# Patient Record
Sex: Male | Born: 1975 | Race: White | Hispanic: No | Marital: Married | Smoking: Former smoker
Health system: Southern US, Community
[De-identification: ages and names within clinical notes are randomized; demographics above are authoritative.]

## PROBLEM LIST (undated history)

## (undated) DIAGNOSIS — K219 Gastro-esophageal reflux disease without esophagitis: Secondary | ICD-10-CM

## (undated) DIAGNOSIS — Z789 Other specified health status: Secondary | ICD-10-CM

## (undated) HISTORY — PX: NO PAST SURGERIES: SHX2092

## (undated) HISTORY — DX: Other specified health status: Z78.9

---

## 2019-04-19 ENCOUNTER — Ambulatory Visit (INDEPENDENT_AMBULATORY_CARE_PROVIDER_SITE_OTHER): Payer: Self-pay | Admitting: Otolaryngology

## 2020-03-19 ENCOUNTER — Ambulatory Visit (INDEPENDENT_AMBULATORY_CARE_PROVIDER_SITE_OTHER): Payer: BC Managed Care – PPO | Admitting: Medical-Surgical

## 2020-03-19 ENCOUNTER — Other Ambulatory Visit: Payer: Self-pay

## 2020-03-19 ENCOUNTER — Encounter: Payer: Self-pay | Admitting: Medical-Surgical

## 2020-03-19 VITALS — BP 130/79 | HR 81 | Temp 97.8°F | Ht 68.5 in | Wt 165.2 lb

## 2020-03-19 DIAGNOSIS — Z8639 Personal history of other endocrine, nutritional and metabolic disease: Secondary | ICD-10-CM

## 2020-03-19 DIAGNOSIS — M25512 Pain in left shoulder: Secondary | ICD-10-CM

## 2020-03-19 DIAGNOSIS — G8929 Other chronic pain: Secondary | ICD-10-CM

## 2020-03-19 DIAGNOSIS — Z0000 Encounter for general adult medical examination without abnormal findings: Secondary | ICD-10-CM

## 2020-03-19 DIAGNOSIS — Z7689 Persons encountering health services in other specified circumstances: Secondary | ICD-10-CM

## 2020-03-19 DIAGNOSIS — K4090 Unilateral inguinal hernia, without obstruction or gangrene, not specified as recurrent: Secondary | ICD-10-CM

## 2020-03-19 DIAGNOSIS — G4701 Insomnia due to medical condition: Secondary | ICD-10-CM

## 2020-03-19 MED ORDER — BACLOFEN 10 MG PO TABS: 10.0000 mg | ORAL_TABLET | Freq: Every evening | ORAL | 0 refills | Status: DC | PRN

## 2020-03-19 NOTE — Progress Notes (Signed)
New Patient Office Visit  Subjective:  Patient ID: Reginald Fischer, male    DOB: Jul 21, 1975  Age: 45 y.o. MRN: [id number]  CC:  Chief Complaint  Patient presents with  . Establish Care    HPI Reginald Fischer presents to establish care.   Hernia-Notes that he developed a right inguinal bulge about 7 months ago. The bulge is not necessarily painful but does get irritated if he walks a lot or lifts anything. He is very active usually and notes that this possible hernia has started to become irritating. He now reports there is some slight pain on the left side as well. Has not tried anything for the hernia or discomfort.  History of left shoulder and neck problems. 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 He was doing physical therapy and dry needling but has not been since June. Notes that he plans to return to physical therapy and he already has a provider in place. Has difficulty sleeping at night due to left shoulder pain and preferred sleeping position on the left side. Uses CBD and THC regularly.  Insomnia-this is mostly related to his physical discomfort related to his left shoulder and neck. He has difficulty falling asleep to a slight degree but has trouble staying asleep at night because he cannot get comfortable. He does take 2 Advil PM nightly which used to be effective but is not so much now. Notes that years ago, he took Ambien to help with sleep.  Past Medical History:  Diagnosis Date  . No pertinent past medical history     Past Surgical History:  Procedure Laterality Date  . NO PAST SURGERIES      Family History  Family history unknown: Yes    Social History   Socioeconomic History  . Marital status: Married    Spouse name: Not on file  . Number of children: Not on file  . Years of education: Not on file  . Highest education level: Not on file  Occupational History  . Not on file  Tobacco Use  . Smoking status: Former Smoker    Quit  date: 05/2019    Years since quitting: 0.8  . Smokeless tobacco: Never Used  Vaping Use  . Vaping Use: Every day  . Substances: CBD  Substance and Sexual Activity  . Alcohol use: Yes    Comment: Occassionally  . Drug use: Yes    Frequency: 7.0 times per week    Types: Marijuana    Comment: daily use  . Sexual activity: Yes    Birth control/protection: None  Other Topics Concern  . Not on file  Social History Narrative  . Not on file   Social Determinants of Health   Financial Resource Strain: Not on file  Food Insecurity: Not on file  Transportation Needs: Not on file  Physical Activity: Not on file  Stress: Not on file  Social Connections: Not on file  Intimate Partner Violence: Not on file    ROS Review of Systems  Constitutional: Negative for chills, fatigue, fever and unexpected weight change.  Respiratory: Positive for cough (25 year smoking history; THC ). Negative for shortness of breath and wheezing.   Cardiovascular: Negative for chest pain, palpitations and leg swelling.  Genitourinary: Negative for dysuria, frequency, hematuria and urgency.  Musculoskeletal: Positive for arthralgias, myalgias and neck pain.  Neurological: Negative for dizziness, light-headedness and headaches.  Psychiatric/Behavioral: Positive for sleep disturbance. Negative for dysphoric mood, self-injury and suicidal  ideas. The patient is nervous/anxious.     Objective:   Today's Vitals: BP 130/79   Pulse 81   Temp 97.8 F (36.6 C)   Ht 5' 8.5" (1.74 m)   Wt 165 lb 3.2 oz (74.9 kg)   SpO2 97%   BMI 24.75 kg/m   Physical Exam Vitals and nursing note reviewed.  Constitutional:      General: He is not in acute distress.    Appearance: Normal appearance.  HENT:     Head: Normocephalic and atraumatic.  Cardiovascular:     Rate and Rhythm: Normal rate and regular rhythm.     Pulses: Normal pulses.     Heart sounds: Normal heart sounds. No murmur heard. No friction rub. No  gallop.   Pulmonary:     Effort: Pulmonary effort is normal. No respiratory distress.     Breath sounds: Normal breath sounds.  Abdominal:     General: Abdomen is flat. There is no distension.     Palpations: Abdomen is soft.     Hernia: A hernia (Right inguinal) is present.  Musculoskeletal:        General: Tenderness (Left shoulder and neck) present.  Skin:    General: Skin is warm and dry.  Neurological:     Mental Status: He is alert and oriented to person, place, and time.  Psychiatric:        Mood and Affect: Mood normal.        Behavior: Behavior normal.        Thought Content: Thought content normal.        Judgment: Judgment normal.    Assessment & Plan:   1. Encounter to establish care Reviewed available information and discussed healthcare concerns with patient. He is well overdue for preventative care so we will add this to our work-up today.  2. Unilateral inguinal hernia without obstruction or gangrene, recurrence not specified Notable right inguinal bulge that is soft and reducible, nontender. Referring to general surgery for further evaluation. - Ambulatory referral to General Surgery  3. Insomnia due to medical condition Since his insomnia is likely due to chronic pain and discomfort, we will trial Baclofen 10mg  nightly as needed to see if this is helpful.   4. Chronic left shoulder pain Resume physical therapy and dry needling. Tylenol and ibuprofen as needed. Baclofen 10 mg nightly as needed. Requested patient bring in imaging disc from his x-rays completed by his chiropractor for review.  5. Encounter for preventive care Checking CBC, CMP, and lipid panel today. - CBC - COMPLETE METABOLIC PANEL WITH GFR - Lipid panel  6. History of vitamin D deficiency Continue vitamin D supplementation. Checking vitamin D levels today. - VITAMIN D 25 Hydroxy (Vit-D Deficiency, Fractures)   Outpatient Encounter Medications as of 03/19/2020  Medication Sig  .  ASCORBIC ACID PO Take by mouth.  . baclofen (LIORESAL) 10 MG tablet Take 1 tablet (10 mg total) by mouth at bedtime as needed for muscle spasms.  . Multiple Vitamin (MULTIVITAMIN) tablet Take 1 tablet by mouth daily.  . TURMERIC PO Take by mouth.  . Vitamin D, Cholecalciferol, 25 MCG (1000 UT) TABS Take 1 tablet by mouth daily.   No facility-administered encounter medications on file as of 03/19/2020.    Follow-up: Return for insomnia/shoulder pain follow up in 3-4 weeks.   05/17/2020, DNP, APRN, FNP-BC [facility]

## 2020-04-10 ENCOUNTER — Other Ambulatory Visit: Payer: Self-pay

## 2020-04-10 ENCOUNTER — Ambulatory Visit (INDEPENDENT_AMBULATORY_CARE_PROVIDER_SITE_OTHER): Payer: BC Managed Care – PPO | Admitting: Medical-Surgical

## 2020-04-10 ENCOUNTER — Encounter: Payer: Self-pay | Admitting: Medical-Surgical

## 2020-04-10 VITALS — BP 136/80 | HR 95 | Temp 98.3°F | Ht 68.5 in | Wt 166.1 lb

## 2020-04-10 DIAGNOSIS — M25512 Pain in left shoulder: Secondary | ICD-10-CM | POA: Diagnosis not present

## 2020-04-10 DIAGNOSIS — G8929 Other chronic pain: Secondary | ICD-10-CM

## 2020-04-10 DIAGNOSIS — G4701 Insomnia due to medical condition: Secondary | ICD-10-CM

## 2020-04-10 LAB — CBC
HCT: 45.9 % (ref 38.5–50.0)
Hemoglobin: 16.5 g/dL (ref 13.2–17.1)
MCH: 31.3 pg (ref 27.0–33.0)
MCHC: 35.9 g/dL (ref 32.0–36.0)
MCV: 87.1 fL (ref 80.0–100.0)
MPV: 9.6 fL (ref 7.5–12.5)
Platelets: 224 10*3/uL (ref 140–400)
RBC: 5.27 10*6/uL (ref 4.20–5.80)
RDW: 12.7 % (ref 11.0–15.0)
WBC: 5.9 10*3/uL (ref 3.8–10.8)

## 2020-04-10 LAB — COMPLETE METABOLIC PANEL WITH GFR
AG Ratio: 1.6 (calc) (ref 1.0–2.5)
ALT: 27 U/L (ref 9–46)
AST: 19 U/L (ref 10–40)
Albumin: 4.2 g/dL (ref 3.6–5.1)
Alkaline phosphatase (APISO): 56 U/L (ref 36–130)
BUN: 19 mg/dL (ref 7–25)
CO2: 25 mmol/L (ref 20–32)
Calcium: 9.1 mg/dL (ref 8.6–10.3)
Chloride: 108 mmol/L (ref 98–110)
Creat: 0.89 mg/dL (ref 0.60–1.35)
GFR, Est African American: 122 mL/min/{1.73_m2} (ref >=60)
GFR, Est Non African American: 105 mL/min/{1.73_m2} (ref >=60)
Globulin: 2.6 g/dL (calc) (ref 1.9–3.7)
Glucose, Bld: 97 mg/dL (ref 65–99)
Potassium: 3.8 mmol/L (ref 3.5–5.3)
Sodium: 140 mmol/L (ref 135–146)
Total Bilirubin: 0.5 mg/dL (ref 0.2–1.2)
Total Protein: 6.8 g/dL (ref 6.1–8.1)

## 2020-04-10 LAB — LIPID PANEL
Cholesterol: 181 mg/dL (ref <200)
HDL: 29 mg/dL — ABNORMAL LOW (ref >=40)
LDL Cholesterol (Calc): 118 mg/dL (calc) — ABNORMAL HIGH
Non-HDL Cholesterol (Calc): 152 mg/dL (calc) — ABNORMAL HIGH (ref <130)
Total CHOL/HDL Ratio: 6.2 (calc) — ABNORMAL HIGH (ref <5.0)
Triglycerides: 218 mg/dL — ABNORMAL HIGH (ref <150)

## 2020-04-10 MED ORDER — PREDNISONE 50 MG PO TABS: 50.0000 mg | ORAL_TABLET | Freq: Every day | ORAL | 0 refills | Status: DC

## 2020-04-10 MED ORDER — GABAPENTIN 100 MG PO CAPS: 100.0000 mg | ORAL_CAPSULE | Freq: Every evening | ORAL | 3 refills | Status: DC

## 2020-04-10 MED ORDER — MELOXICAM 15 MG PO TABS: 15.0000 mg | ORAL_TABLET | Freq: Every day | ORAL | 0 refills | Status: DC

## 2020-04-10 NOTE — Progress Notes (Signed)
Subjective:    CC: body pain, insomnia  HPI: Pleasant 45 year old male presenting for follow up on shoulder pain and insomnia. Notes that the Baclofen that we tried has not helped with his pain or trouble sleeping. He is still doing weekly physical therapy and notes that dry needling does help some. Dry needling is followed by 1 day of pain then 2 days of relief before the pain returns. Most notably helpful is heat but this is temporary. Has not tried medications before but notes he has been dealing with this pain since 2018. It originally started in his left shoulder but now involves the left and right shoulder, clavicles, upper sternum, and right buttock. Reports the physical therapist told him he is hypermobile. Has a lot of joint popping and cracking on a daily basis. Previously told that his vitamin D was very low and had been prescribed replacement. He noted no improvement with the vitamin D supplements. Vapes with CBD/THC on a daily basis. Difficulty falling asleep and staying asleep at night due to pain. Brought a disc with images of his cervical spine that were done recently.   I reviewed the past medical history, family history, social history, surgical history, and allergies today and no changes were needed.  Please see the problem list section below in epic for further details.  Past Medical History: Past Medical History:  Diagnosis Date  . No pertinent past medical history    Past Surgical History: Past Surgical History:  Procedure Laterality Date  . NO PAST SURGERIES     Social History: Social History   Socioeconomic History  . Marital status: Married    Spouse name: Not on file  . Number of children: Not on file  . Years of education: Not on file  . Highest education level: Not on file  Occupational History  . Not on file  Tobacco Use  . Smoking status: Former Smoker    Quit date: 05/2019    Years since quitting: 0.9  . Smokeless tobacco: Never Used  Vaping Use  .  Vaping Use: Every day  . Substances: CBD  Substance and Sexual Activity  . Alcohol use: Yes    Comment: Occassionally  . Drug use: Yes    Frequency: 7.0 times per week    Types: Marijuana    Comment: daily use  . Sexual activity: Yes    Birth control/protection: None  Other Topics Concern  . Not on file  Social History Narrative  . Not on file   Social Determinants of Health   Financial Resource Strain: Not on file  Food Insecurity: Not on file  Transportation Needs: Not on file  Physical Activity: Not on file  Stress: Not on file  Social Connections: Not on file   Family History: Family History  Family history unknown: Yes   Allergies: No Known Allergies Medications: See med rec.  Review of Systems: See HPI for pertinent positives and negatives.   Objective:    General: Well Developed, well nourished, and in no acute distress.  Neuro: Alert and oriented x3.  HEENT: Normocephalic, atraumatic.  Skin: Warm and dry. Cardiac: Regular rate and rhythm.  Respiratory: Not using accessory muscles, speaking in full sentences.  Impression and Recommendations:    1. Chronic left shoulder pain/Insomnia Discontinue Baclofen. Start Gabapentin 100-300mg  nightly at bedtime. Recommend starting with 100mg  and titrating up to 300mg  to evaluate response. Continue PT, dry needling, heat, and other conservative measures. Reviewed cervical spine images, no red flags. With the  widespread pain, suspect an inflammatory process. Start Prednisone 50mg  daily x 5 days. On day 6, start meloxicam 15mg  daily.   Return in about 4 weeks (around 05/08/2020) for insomnia/pain follow up. ___________________________________________ , DNP, APRN, FNP-BC Primary Care and Sports Medicine [facility]

## 2020-04-12 ENCOUNTER — Other Ambulatory Visit: Payer: Self-pay

## 2020-04-12 DIAGNOSIS — E782 Mixed hyperlipidemia: Secondary | ICD-10-CM

## 2020-04-12 MED ORDER — ATORVASTATIN CALCIUM 20 MG PO TABS: 20.0000 mg | ORAL_TABLET | Freq: Every day | ORAL | 3 refills | Status: DC

## 2020-04-12 NOTE — Addendum Note (Signed)
Addended byChristen Butter on: 04/12/2020 07:16 PM   Modules accepted: Orders

## 2020-05-08 ENCOUNTER — Ambulatory Visit (INDEPENDENT_AMBULATORY_CARE_PROVIDER_SITE_OTHER): Payer: BC Managed Care – PPO | Admitting: Medical-Surgical

## 2020-05-08 ENCOUNTER — Encounter: Payer: Self-pay | Admitting: Medical-Surgical

## 2020-05-08 ENCOUNTER — Other Ambulatory Visit: Payer: Self-pay | Admitting: Medical-Surgical

## 2020-05-08 ENCOUNTER — Other Ambulatory Visit: Payer: Self-pay

## 2020-05-08 VITALS — BP 109/72 | HR 81 | Temp 97.9°F | Ht 68.5 in | Wt 163.0 lb

## 2020-05-08 DIAGNOSIS — G4701 Insomnia due to medical condition: Secondary | ICD-10-CM | POA: Diagnosis not present

## 2020-05-08 DIAGNOSIS — G8929 Other chronic pain: Secondary | ICD-10-CM

## 2020-05-08 DIAGNOSIS — M25512 Pain in left shoulder: Secondary | ICD-10-CM | POA: Diagnosis not present

## 2020-05-08 MED ORDER — AMITRIPTYLINE HCL 25 MG PO TABS
ORAL_TABLET | ORAL | 0 refills | Status: DC
Start: 2020-05-08 — End: 2020-06-05

## 2020-05-08 NOTE — Progress Notes (Signed)
Subjective:    CC: Neck/chest/shoulder pain follow up  HPI: Pleasant 45 year old male presenting today for follow-up of neck/chest/shoulder pain.  He was seen approximately 1 month ago and prescribed several agents including meloxicam daily after a 5-day prednisone burst, baclofen, and gabapentin.  He completed the prednisone burst dose with mild improvement in symptoms then started taking the meloxicam 15 mg daily.  He has not noted any improvement in symptoms with meloxicam.  He did try baclofen for a short while but this was not helpful with his symptoms at all.  He is taking gabapentin at night to help with sleep.  He started out at 100 mg nightly but has increased gradually to 400 mg nightly.  The increased dose will work for a few nights before it loses its efficacy.  He has no difficulty falling asleep but has trouble staying asleep since he is tossing and turning trying to find a comfortable spot with all of his pain.  Notes the pain is involves his left shoulder, left chest, posterior cervical spine wrapping around both sides to the SCMs bilaterally.  Has not found anything that makes his pain better.  He did do a brief stint of physical therapy with dry needling which seemed to help but the results were very temporary.   I reviewed the past medical history, family history, social history, surgical history, and allergies today and no changes were needed.  Please see the problem list section below in epic for further details.  Past Medical History: Past Medical History:  Diagnosis Date  . No pertinent past medical history    Past Surgical History: Past Surgical History:  Procedure Laterality Date  . NO PAST SURGERIES     Social History: Social History   Socioeconomic History  . Marital status: Married    Spouse name: Not on file  . Number of children: Not on file  . Years of education: Not on file  . Highest education level: Not on file  Occupational History  . Not on file   Tobacco Use  . Smoking status: Former Smoker    Quit date: 05/2019    Years since quitting: 1.0  . Smokeless tobacco: Never Used  Vaping Use  . Vaping Use: Every day  . Substances: CBD  Substance and Sexual Activity  . Alcohol use: Yes    Comment: Occassionally  . Drug use: Yes    Frequency: 7.0 times per week    Types: Marijuana    Comment: daily use  . Sexual activity: Yes    Birth control/protection: None  Other Topics Concern  . Not on file  Social History Narrative  . Not on file   Social Determinants of Health   Financial Resource Strain: Not on file  Food Insecurity: Not on file  Transportation Needs: Not on file  Physical Activity: Not on file  Stress: Not on file  Social Connections: Not on file   Family History: Family History  Family history unknown: Yes   Allergies: No Known Allergies Medications: See med rec.  Review of Systems: See HPI for pertinent positives and negatives.   Objective:    General: Well Developed, well nourished, and in no acute distress.  Neuro: Alert and oriented x3.  HEENT: Normocephalic, atraumatic.  Skin: Warm and dry. Cardiac: Regular rate and rhythm, no murmurs rubs or gallops, no lower extremity edema.  Respiratory: Clear to auscultation bilaterally. Not using accessory muscles, speaking in full sentences.  Impression and Recommendations:    1.  Chronic pain/insomnia Unclear etiology of pain.  Since it is in multiple locations, further imaging not likely to provide [place] with helpful information.  Pain areas do seem to be muscular in nature.  Discontinue nightly dose of gabapentin.  Start amitriptyline 25 mg nightly x7 days then increase to 50 mg nightly.  Okay to use 100-200 mg gabapentin every morning and at lunchtime as needed for pain.  Continue meloxicam 15 mg daily.  If no improvement on these medications, would like him to see our sports medicine provider, Dr. Karie Schwalbe.  Return in about 4 weeks (around 06/05/2020) for  neck/shoulder pain and insomnia follow up. ___________________________________________ Thayer Ohm, DNP, APRN, FNP-BC Primary Care and Sports Medicine [facility]

## 2020-05-28 ENCOUNTER — Ambulatory Visit: Payer: BC Managed Care – PPO | Admitting: Sports Medicine

## 2020-06-05 ENCOUNTER — Encounter: Payer: Self-pay | Admitting: Medical-Surgical

## 2020-06-05 ENCOUNTER — Ambulatory Visit (INDEPENDENT_AMBULATORY_CARE_PROVIDER_SITE_OTHER): Payer: BC Managed Care – PPO | Admitting: Medical-Surgical

## 2020-06-05 ENCOUNTER — Ambulatory Visit (INDEPENDENT_AMBULATORY_CARE_PROVIDER_SITE_OTHER): Payer: BC Managed Care – PPO | Admitting: Sports Medicine

## 2020-06-05 ENCOUNTER — Other Ambulatory Visit: Payer: Self-pay | Admitting: Medical-Surgical

## 2020-06-05 ENCOUNTER — Other Ambulatory Visit: Payer: Self-pay

## 2020-06-05 ENCOUNTER — Ambulatory Visit (INDEPENDENT_AMBULATORY_CARE_PROVIDER_SITE_OTHER): Payer: BC Managed Care – PPO

## 2020-06-05 VITALS — BP 108/74 | HR 103 | Temp 98.8°F | Ht 68.5 in | Wt 166.7 lb

## 2020-06-05 DIAGNOSIS — M542 Cervicalgia: Secondary | ICD-10-CM

## 2020-06-05 DIAGNOSIS — G8929 Other chronic pain: Secondary | ICD-10-CM

## 2020-06-05 DIAGNOSIS — M25512 Pain in left shoulder: Secondary | ICD-10-CM | POA: Diagnosis not present

## 2020-06-05 DIAGNOSIS — G4701 Insomnia due to medical condition: Secondary | ICD-10-CM

## 2020-06-05 MED ORDER — CEPHALEXIN 500 MG PO CAPS: 500.0000 mg | ORAL_CAPSULE | Freq: Two times a day (BID) | ORAL | 0 refills | Status: DC

## 2020-06-05 MED ORDER — GABAPENTIN 300 MG PO CAPS: ORAL_CAPSULE | ORAL | 3 refills | Status: AC

## 2020-06-05 NOTE — Progress Notes (Signed)
Subjective:    CC: Insomnia and neck/shoulder pain  HPI: Pleasant 45 year old male presenting today to discuss insomnia and neck/shoulder pain.  He was last seen on 3/1 where we started amitriptyline with titration from 25 to 50 mg nightly after 1 week.  Notes that he has been taking the medication but it leaves him significantly groggy in the mornings.  Because of this, he has been alternating from skipping the medication completely to only taking 25 mg nightly but on nights that he does not have to work the next day, the will take 50 mg.  Notes that he is able to go to sleep without difficulty and is able to fall back to sleep much faster than previously.  Unfortunately, he is still waking several times a night with pain in his neck and shoulders.  Endorses using gabapentin 100 mg every morning to help with his pain level but notes this wears off just before dinner every day and plans to start taking 100 mg in the afternoons as well.  Has not noticed much difference in his overall pain levels and has an appointment to follow-up for further evaluation with Dr. Karie Schwalbe after this appointment today.  I reviewed the past medical history, family history, social history, surgical history, and allergies today and no changes were needed.  Please see the problem list section below in epic for further details.  Past Medical History: Past Medical History:  Diagnosis Date  . No pertinent past medical history    Past Surgical History: Past Surgical History:  Procedure Laterality Date  . NO PAST SURGERIES     Social History: Social History   Socioeconomic History  . Marital status: Married    Spouse name: Not on file  . Number of children: Not on file  . Years of education: Not on file  . Highest education level: Not on file  Occupational History  . Not on file  Tobacco Use  . Smoking status: Former Smoker    Quit date: 05/2019    Years since quitting: 1.0  . Smokeless tobacco: Never Used  Vaping  Use  . Vaping Use: Every day  . Substances: CBD  Substance and Sexual Activity  . Alcohol use: Yes    Comment: Occassionally  . Drug use: Yes    Frequency: 7.0 times per week    Types: Marijuana    Comment: daily use  . Sexual activity: Yes    Birth control/protection: None  Other Topics Concern  . Not on file  Social History Narrative  . Not on file   Social Determinants of Health   Financial Resource Strain: Not on file  Food Insecurity: Not on file  Transportation Needs: Not on file  Physical Activity: Not on file  Stress: Not on file  Social Connections: Not on file   Family History: Family History  Family history unknown: Yes   Allergies: No Known Allergies Medications: See med rec.  Review of Systems: See HPI for pertinent positives and negatives.   Objective:    General: Well Developed, well nourished, and in no acute distress.  Neuro: Alert and oriented x3.  HEENT: Normocephalic, atraumatic.  Skin: Warm and dry. Cardiac: Regular rate and rhythm.  Respiratory: Not using accessory muscles, speaking in full sentences.  Impression and Recommendations:    1. Chronic left shoulder pain Being evaluated immediately after this appointment by Dr. Karie Schwalbe.  2. Insomnia due to medical condition Continue amitriptyline 25-50 mg nightly this is helping with sleep.  Okay  to use gabapentin 100 mg during the day.  Return for Appointment as scheduled with Dr. Karie Schwalbe. ___________________________________________ Thayer Ohm, DNP, APRN, FNP-BC Primary Care and Sports Medicine [facility] [place]

## 2020-06-05 NOTE — Progress Notes (Signed)
    Procedures performed today:    None.  Independent interpretation of notes and tests performed by another provider:   None.  Brief History, Exam, Impression, and Recommendations:    Chronic neck pain This is a pleasant 45 year old male, he has a several year history of pain in the left side of his neck with radiation to the sternoclavicular joint, over the shoulder and around his periscapular region. He has had chiropractic manipulation, x-rays, steroids, NSAIDs, currently on amitriptyline and gabapentin all without much efficacy. Shoulder examination is unremarkable, sternoclavicular examination is unremarkable, cervical spine examination is for the most part unremarkable.  Good reflexes. I do think we really need to just go up on the gabapentin dose, we will do an up taper starting at 300 at bedtime. He can discontinue his amitriptyline for now. Finishes prednisone. Due to greater than 6 weeks of physician directed conservative treatment we will proceed with x-rays and MRI as well. We did discuss expectations for chronic neck pain.    ___________________________________________ Ihor Austin. Benjamin Stain, M.D., ABFM., CAQSM. Primary Care and Sports Medicine [facility]  Adjunct Instructor of Family Medicine  [employer]

## 2020-06-05 NOTE — Assessment & Plan Note (Signed)
This is a pleasant 45 year old male, he has a several year history of pain in the left side of his neck with radiation to the sternoclavicular joint, over the shoulder and around his periscapular region. He has had chiropractic manipulation, x-rays, steroids, NSAIDs, currently on amitriptyline and gabapentin all without much efficacy. Shoulder examination is unremarkable, sternoclavicular examination is unremarkable, cervical spine examination is for the most part unremarkable.  Good reflexes. I do think we really need to just go up on the gabapentin dose, we will do an up taper starting at 300 at bedtime. He can discontinue his amitriptyline for now. Finishes prednisone. Due to greater than 6 weeks of physician directed conservative treatment we will proceed with x-rays and MRI as well. We did discuss expectations for chronic neck pain.

## 2020-06-08 ENCOUNTER — Other Ambulatory Visit: Payer: Self-pay | Admitting: Medical-Surgical

## 2020-06-11 ENCOUNTER — Ambulatory Visit (INDEPENDENT_AMBULATORY_CARE_PROVIDER_SITE_OTHER): Payer: BC Managed Care – PPO

## 2020-06-11 ENCOUNTER — Other Ambulatory Visit: Payer: Self-pay

## 2020-06-11 DIAGNOSIS — G8929 Other chronic pain: Secondary | ICD-10-CM | POA: Diagnosis not present

## 2020-06-11 DIAGNOSIS — M542 Cervicalgia: Secondary | ICD-10-CM

## 2020-06-30 ENCOUNTER — Other Ambulatory Visit (HOSPITAL_COMMUNITY): Payer: BC Managed Care – PPO

## 2020-07-02 ENCOUNTER — Other Ambulatory Visit: Payer: Self-pay | Admitting: Medical-Surgical

## 2020-07-02 ENCOUNTER — Other Ambulatory Visit (HOSPITAL_COMMUNITY)
Admission: RE | Admit: 2020-07-02 | Discharge: 2020-07-02 | Disposition: A | Discharge status: Home / Self Care | Payer: BC Managed Care – PPO | Source: Ambulatory Visit | Attending: General Surgery | Admitting: General Surgery

## 2020-07-02 DIAGNOSIS — Z20822 Contact with and (suspected) exposure to covid-19: Secondary | ICD-10-CM | POA: Insufficient documentation

## 2020-07-02 DIAGNOSIS — K429 Umbilical hernia without obstruction or gangrene: Secondary | ICD-10-CM | POA: Diagnosis not present

## 2020-07-02 DIAGNOSIS — Z01812 Encounter for preprocedural laboratory examination: Secondary | ICD-10-CM | POA: Insufficient documentation

## 2020-07-02 DIAGNOSIS — Z7952 Long term (current) use of systemic steroids: Secondary | ICD-10-CM | POA: Diagnosis not present

## 2020-07-02 DIAGNOSIS — Z79899 Other long term (current) drug therapy: Secondary | ICD-10-CM | POA: Diagnosis not present

## 2020-07-02 DIAGNOSIS — Z791 Long term (current) use of non-steroidal anti-inflammatories (NSAID): Secondary | ICD-10-CM | POA: Diagnosis not present

## 2020-07-02 DIAGNOSIS — F17200 Nicotine dependence, unspecified, uncomplicated: Secondary | ICD-10-CM | POA: Diagnosis not present

## 2020-07-02 DIAGNOSIS — K4020 Bilateral inguinal hernia, without obstruction or gangrene, not specified as recurrent: Secondary | ICD-10-CM | POA: Diagnosis not present

## 2020-07-02 LAB — SARS CORONAVIRUS 2 (TAT 6-24 HRS): SARS Coronavirus 2: NEGATIVE

## 2020-07-03 ENCOUNTER — Encounter (HOSPITAL_COMMUNITY): Payer: Self-pay | Admitting: General Surgery

## 2020-07-03 ENCOUNTER — Ambulatory Visit: Payer: BC Managed Care – PPO | Admitting: Sports Medicine

## 2020-07-03 ENCOUNTER — Other Ambulatory Visit: Payer: Self-pay

## 2020-07-03 NOTE — Anesthesia Preprocedure Evaluation (Addendum)
Anesthesia Evaluation  Patient identified by MRN, date of birth, ID band Patient awake    Reviewed: Allergy & Precautions, NPO status , Patient's Chart, lab work & pertinent test results  History of Anesthesia Complications Negative for: history of anesthetic complications  Airway Mallampati: II  TM Distance: >3 FB Neck ROM: Full    Dental  (+) Poor Dentition, Missing, Chipped,    Pulmonary Patient abstained from smoking., former smoker,    Pulmonary exam normal        Cardiovascular negative cardio ROS Normal cardiovascular exam     Neuro/Psych negative neurological ROS  negative psych ROS   GI/Hepatic GERD  Controlled,(+)     substance abuse  marijuana use, Bilateral inguinal hernias, umbilical hernia   Endo/Other  negative endocrine ROS  Renal/GU negative Renal ROS  negative genitourinary   Musculoskeletal negative musculoskeletal ROS (+)   Abdominal   Peds  Hematology negative hematology ROS (+)   Anesthesia Other Findings Day of surgery medications reviewed with patient.  Reproductive/Obstetrics negative OB ROS                            Anesthesia Physical Anesthesia Plan  ASA: II  Anesthesia Plan: General   Post-op Pain Management:    Induction: Intravenous  PONV Risk Score and Plan: 3 and Treatment may vary due to age or medical condition, Ondansetron, Dexamethasone and Midazolam  Airway Management Planned: Oral ETT  Additional Equipment: None  Intra-op Plan:   Post-operative Plan: Extubation in OR  Informed Consent: I have reviewed the patients History and Physical, chart, labs and discussed the procedure including the risks, benefits and alternatives for the proposed anesthesia with the patient or authorized representative who has indicated his/her understanding and acceptance.     Dental advisory given  Plan Discussed with: CRNA  Anesthesia Plan  Comments:        Anesthesia Quick Evaluation

## 2020-07-03 NOTE — Progress Notes (Signed)
PCP - Dr. Larinda Buttery  Cardiologist - Denies  Chest x-ray - Denies  EKG - Denies  Stress Test - Denies  ECHO - Denies  Cardiac Cath - Denies  AICD-na PM-na LOOP-na  Sleep Study - Denies CPAP - Denies  LABS- 07/02/20: COVID-Neg 07/04/20: CBC  ASA- Denies  ERAS- No  HA1C- Denies  Anesthesia- No  Pt denies having chest pain, sob, or fever the pre-op phone call. All instructions explained to the pt, with a verbal understanding of the material including: as of today, stop taking all Aspirin (unless instructed by your doctor) and Other Aspirin containing products, Vitamins, Fish oils, and Herbal medications. Also stop all NSAIDS i.e. Advil, Ibuprofen, Motrin, Aleve, Anaprox, Naproxen, BC, Goody Powders, and all Supplements. Pt also instructed to self quarantine after being tested for COVID-19. The opportunity to ask questions was provided.   Coronavirus Screening  Have you experienced the following symptoms:  Cough yes/no: No Fever (>100.21F)  yes/no: No Runny nose yes/no: No Sore throat yes/no: No Difficulty breathing/shortness of breath  yes/no: No  Have you or a family member traveled in the last 14 days and where? yes/no: No   If the patient indicates "YES" to the above questions, their PAT will be rescheduled to limit the exposure to others and, the surgeon will be notified. THE PATIENT WILL NEED TO BE ASYMPTOMATIC FOR 14 DAYS.   If the patient is not experiencing any of these symptoms, the PAT nurse will instruct them to NOT bring anyone with them to their appointment since they may have these symptoms or traveled as well.   Please remind your patients and families that hospital visitation restrictions are in effect and the importance of the restrictions.

## 2020-07-04 ENCOUNTER — Ambulatory Visit (HOSPITAL_COMMUNITY): Payer: BC Managed Care – PPO | Admitting: Anesthesiology

## 2020-07-04 ENCOUNTER — Ambulatory Visit (HOSPITAL_COMMUNITY)
Admission: RE | Admit: 2020-07-04 | Discharge: 2020-07-04 | Disposition: A | Discharge status: Home / Self Care | Payer: BC Managed Care – PPO | Attending: General Surgery | Admitting: General Surgery

## 2020-07-04 ENCOUNTER — Encounter (HOSPITAL_COMMUNITY)
Admission: RE | Disposition: A | Discharge status: Home / Self Care | Payer: Self-pay | Source: Home / Self Care | Attending: General Surgery

## 2020-07-04 ENCOUNTER — Encounter (HOSPITAL_COMMUNITY): Payer: Self-pay | Admitting: General Surgery

## 2020-07-04 DIAGNOSIS — K4020 Bilateral inguinal hernia, without obstruction or gangrene, not specified as recurrent: Secondary | ICD-10-CM | POA: Diagnosis not present

## 2020-07-04 DIAGNOSIS — Z20822 Contact with and (suspected) exposure to covid-19: Secondary | ICD-10-CM | POA: Insufficient documentation

## 2020-07-04 DIAGNOSIS — K429 Umbilical hernia without obstruction or gangrene: Secondary | ICD-10-CM | POA: Insufficient documentation

## 2020-07-04 DIAGNOSIS — F17200 Nicotine dependence, unspecified, uncomplicated: Secondary | ICD-10-CM | POA: Insufficient documentation

## 2020-07-04 DIAGNOSIS — Z791 Long term (current) use of non-steroidal anti-inflammatories (NSAID): Secondary | ICD-10-CM | POA: Insufficient documentation

## 2020-07-04 DIAGNOSIS — Z7952 Long term (current) use of systemic steroids: Secondary | ICD-10-CM | POA: Insufficient documentation

## 2020-07-04 DIAGNOSIS — Z79899 Other long term (current) drug therapy: Secondary | ICD-10-CM | POA: Insufficient documentation

## 2020-07-04 HISTORY — PX: INGUINAL HERNIA REPAIR: SHX194

## 2020-07-04 HISTORY — PX: UMBILICAL HERNIA REPAIR: SHX196

## 2020-07-04 HISTORY — DX: Gastro-esophageal reflux disease without esophagitis: K21.9

## 2020-07-04 HISTORY — PX: INSERTION OF MESH: SHX5868

## 2020-07-04 LAB — CBC
HCT: 46 % (ref 39.0–52.0)
Hemoglobin: 16.2 g/dL (ref 13.0–17.0)
MCH: 31.3 pg (ref 26.0–34.0)
MCHC: 35.2 g/dL (ref 30.0–36.0)
MCV: 89 fL (ref 80.0–100.0)
Platelets: 214 10*3/uL (ref 150–400)
RBC: 5.17 MIL/uL (ref 4.22–5.81)
RDW: 12.5 % (ref 11.5–15.5)
WBC: 6.1 10*3/uL (ref 4.0–10.5)
nRBC: 0 % (ref 0.0–0.2)

## 2020-07-04 SURGERY — REPAIR, HERNIA, INGUINAL, LAPAROSCOPIC
Anesthesia: General | Site: Inguinal

## 2020-07-04 MED ORDER — DEXAMETHASONE SODIUM PHOSPHATE 10 MG/ML IJ SOLN
INTRAMUSCULAR | Status: AC
  Filled 2020-07-04: qty 1

## 2020-07-04 MED ORDER — PROPOFOL 10 MG/ML IV BOLUS
INTRAVENOUS | Status: AC
  Filled 2020-07-04: qty 40

## 2020-07-04 MED ORDER — ROCURONIUM BROMIDE 10 MG/ML (PF) SYRINGE
PREFILLED_SYRINGE | INTRAVENOUS | Status: DC | PRN
  Administered 2020-07-04: 50 mg via INTRAVENOUS
  Administered 2020-07-04: 40 mg via INTRAVENOUS
  Administered 2020-07-04: 10 mg via INTRAVENOUS

## 2020-07-04 MED ORDER — ACETAMINOPHEN 500 MG PO TABS
1000.0000 mg | ORAL_TABLET | Freq: Once | ORAL | Status: AC
  Administered 2020-07-04: 1000 mg via ORAL
  Filled 2020-07-04: qty 2

## 2020-07-04 MED ORDER — 0.9 % SODIUM CHLORIDE (POUR BTL) OPTIME
TOPICAL | Status: DC | PRN
  Administered 2020-07-04: 1000 mL

## 2020-07-04 MED ORDER — LIDOCAINE 2% (20 MG/ML) 5 ML SYRINGE
INTRAMUSCULAR | Status: DC | PRN
  Administered 2020-07-04: 100 mg via INTRAVENOUS

## 2020-07-04 MED ORDER — CEFAZOLIN SODIUM-DEXTROSE 2-4 GM/100ML-% IV SOLN
INTRAVENOUS | Status: AC
  Filled 2020-07-04: qty 100

## 2020-07-04 MED ORDER — CHLORHEXIDINE GLUCONATE 0.12 % MT SOLN
15.0000 mL | Freq: Once | OROMUCOSAL | Status: AC
  Administered 2020-07-04: 15 mL via OROMUCOSAL
  Filled 2020-07-04: qty 15

## 2020-07-04 MED ORDER — ROCURONIUM BROMIDE 10 MG/ML (PF) SYRINGE
PREFILLED_SYRINGE | INTRAVENOUS | Status: AC
  Filled 2020-07-04: qty 10

## 2020-07-04 MED ORDER — DEXAMETHASONE SODIUM PHOSPHATE 10 MG/ML IJ SOLN
INTRAMUSCULAR | Status: DC | PRN
  Administered 2020-07-04: 5 mg via INTRAVENOUS

## 2020-07-04 MED ORDER — DEXMEDETOMIDINE (PRECEDEX) IN NS 20 MCG/5ML (4 MCG/ML) IV SYRINGE
PREFILLED_SYRINGE | INTRAVENOUS | Status: DC | PRN
  Administered 2020-07-04: 8 ug via INTRAVENOUS

## 2020-07-04 MED ORDER — PHENYLEPHRINE 40 MCG/ML (10ML) SYRINGE FOR IV PUSH (FOR BLOOD PRESSURE SUPPORT)
PREFILLED_SYRINGE | INTRAVENOUS | Status: DC | PRN
  Administered 2020-07-04: 80 ug via INTRAVENOUS

## 2020-07-04 MED ORDER — LIDOCAINE 2% (20 MG/ML) 5 ML SYRINGE
INTRAMUSCULAR | Status: AC
  Filled 2020-07-04: qty 5

## 2020-07-04 MED ORDER — ORAL CARE MOUTH RINSE: 15.0000 mL | Freq: Once | OROMUCOSAL | Status: AC

## 2020-07-04 MED ORDER — TRAMADOL HCL 50 MG PO TABS: 50.0000 mg | ORAL_TABLET | Freq: Four times a day (QID) | ORAL | 0 refills | Status: AC | PRN

## 2020-07-04 MED ORDER — CEFAZOLIN SODIUM-DEXTROSE 2-3 GM-%(50ML) IV SOLR
INTRAVENOUS | Status: DC | PRN
  Administered 2020-07-04: 2 g via INTRAVENOUS

## 2020-07-04 MED ORDER — BUPIVACAINE HCL (PF) 0.25 % IJ SOLN
INTRAMUSCULAR | Status: AC
  Filled 2020-07-04: qty 30

## 2020-07-04 MED ORDER — FENTANYL CITRATE (PF) 250 MCG/5ML IJ SOLN
INTRAMUSCULAR | Status: AC
  Filled 2020-07-04: qty 5

## 2020-07-04 MED ORDER — PROMETHAZINE HCL 25 MG/ML IJ SOLN: 6.2500 mg | INTRAMUSCULAR | Status: DC | PRN

## 2020-07-04 MED ORDER — SUGAMMADEX SODIUM 200 MG/2ML IV SOLN
INTRAVENOUS | Status: DC | PRN
  Administered 2020-07-04: 300 mg via INTRAVENOUS

## 2020-07-04 MED ORDER — ONDANSETRON HCL 4 MG/2ML IJ SOLN
INTRAMUSCULAR | Status: AC
  Filled 2020-07-04: qty 2

## 2020-07-04 MED ORDER — ONDANSETRON HCL 4 MG/2ML IJ SOLN
INTRAMUSCULAR | Status: DC | PRN
  Administered 2020-07-04: 4 mg via INTRAVENOUS

## 2020-07-04 MED ORDER — OXYCODONE HCL 5 MG/5ML PO SOLN: 5.0000 mg | Freq: Once | ORAL | Status: DC | PRN

## 2020-07-04 MED ORDER — FENTANYL CITRATE (PF) 100 MCG/2ML IJ SOLN
INTRAMUSCULAR | Status: AC
  Filled 2020-07-04: qty 2

## 2020-07-04 MED ORDER — BUPIVACAINE HCL 0.25 % IJ SOLN
INTRAMUSCULAR | Status: DC | PRN
  Administered 2020-07-04: 11 mL

## 2020-07-04 MED ORDER — FENTANYL CITRATE (PF) 100 MCG/2ML IJ SOLN
25.0000 ug | INTRAMUSCULAR | Status: DC | PRN
  Administered 2020-07-04 (×4): 25 ug via INTRAVENOUS

## 2020-07-04 MED ORDER — PROPOFOL 10 MG/ML IV BOLUS
INTRAVENOUS | Status: DC | PRN
  Administered 2020-07-04: 20 mg via INTRAVENOUS
  Administered 2020-07-04: 150 mg via INTRAVENOUS
  Administered 2020-07-04: 20 mg via INTRAVENOUS

## 2020-07-04 MED ORDER — MIDAZOLAM HCL 2 MG/2ML IJ SOLN
INTRAMUSCULAR | Status: AC
  Filled 2020-07-04: qty 2

## 2020-07-04 MED ORDER — MIDAZOLAM HCL 2 MG/2ML IJ SOLN
INTRAMUSCULAR | Status: DC | PRN
  Administered 2020-07-04: 2 mg via INTRAVENOUS

## 2020-07-04 MED ORDER — OXYCODONE HCL 5 MG PO TABS: 5.0000 mg | ORAL_TABLET | Freq: Once | ORAL | Status: DC | PRN

## 2020-07-04 MED ORDER — LACTATED RINGERS IV SOLN: INTRAVENOUS | Status: DC

## 2020-07-04 MED ORDER — FENTANYL CITRATE (PF) 250 MCG/5ML IJ SOLN
INTRAMUSCULAR | Status: DC | PRN
  Administered 2020-07-04: 100 ug via INTRAVENOUS
  Administered 2020-07-04: 50 ug via INTRAVENOUS

## 2020-07-04 SURGICAL SUPPLY — 57 items
BLADE CLIPPER SURG (BLADE) IMPLANT
CANISTER SUCT 3000ML PPV (MISCELLANEOUS) ×4 IMPLANT
CHLORAPREP W/TINT 26 (MISCELLANEOUS) ×4 IMPLANT
COVER SURGICAL LIGHT HANDLE (MISCELLANEOUS) ×4 IMPLANT
COVER WAND RF STERILE (DRAPES) IMPLANT
DERMABOND ADVANCED (GAUZE/BANDAGES/DRESSINGS) ×1
DERMABOND ADVANCED .7 DNX12 (GAUZE/BANDAGES/DRESSINGS) ×3 IMPLANT
DISSECTOR BLUNT TIP ENDO 5MM (MISCELLANEOUS) IMPLANT
DRAPE LAPAROTOMY 100X72 PEDS (DRAPES) ×4 IMPLANT
ELECT REM PT RETURN 9FT ADLT (ELECTROSURGICAL) ×4
ELECTRODE REM PT RTRN 9FT ADLT (ELECTROSURGICAL) ×3 IMPLANT
ENDOLOOP SUT PDS II  0 18 (SUTURE) ×1
ENDOLOOP SUT PDS II 0 18 (SUTURE) ×3 IMPLANT
GAUZE 4X4 16PLY RFD (DISPOSABLE) ×4 IMPLANT
GLOVE BIO SURGEON STRL SZ7.5 (GLOVE) ×4 IMPLANT
GLOVE SRG 8 PF TXTR STRL LF DI (GLOVE) ×3 IMPLANT
GLOVE SURG UNDER POLY LF SZ8 (GLOVE) ×1
GOWN STRL REUS W/ TWL LRG LVL3 (GOWN DISPOSABLE) ×6 IMPLANT
GOWN STRL REUS W/ TWL XL LVL3 (GOWN DISPOSABLE) ×3 IMPLANT
GOWN STRL REUS W/TWL LRG LVL3 (GOWN DISPOSABLE) ×2
GOWN STRL REUS W/TWL XL LVL3 (GOWN DISPOSABLE) ×1
KIT BASIN OR (CUSTOM PROCEDURE TRAY) ×4 IMPLANT
KIT TURNOVER KIT B (KITS) ×4 IMPLANT
MESH 3DMAX 5X7 LT XLRG (Mesh General) ×4 IMPLANT
MESH 3DMAX 5X7 RT XLRG (Mesh General) ×4 IMPLANT
NEEDLE HYPO 25GX1X1/2 BEV (NEEDLE) ×4 IMPLANT
NEEDLE INSUFFLATION 14GA 120MM (NEEDLE) IMPLANT
NS IRRIG 1000ML POUR BTL (IV SOLUTION) ×4 IMPLANT
PACK GENERAL/GYN (CUSTOM PROCEDURE TRAY) IMPLANT
PAD ARMBOARD 7.5X6 YLW CONV (MISCELLANEOUS) ×8 IMPLANT
PENCIL SMOKE EVACUATOR (MISCELLANEOUS) IMPLANT
RELOAD STAPLE HERNIA 4.0 BLUE (INSTRUMENTS) ×4 IMPLANT
RELOAD STAPLE HERNIA 4.8 BLK (STAPLE) IMPLANT
SCISSORS LAP 5X35 DISP (ENDOMECHANICALS) ×4 IMPLANT
SET IRRIG TUBING LAPAROSCOPIC (IRRIGATION / IRRIGATOR) IMPLANT
SET TUBE SMOKE EVAC HIGH FLOW (TUBING) ×4 IMPLANT
STAPLER HERNIA 12 8.5 360D (INSTRUMENTS) ×4 IMPLANT
SUT ETHIBOND 0 MO6 C/R (SUTURE) ×4 IMPLANT
SUT MNCRL AB 4-0 PS2 18 (SUTURE) ×4 IMPLANT
SUT PROLENE 0 CT 1 30 (SUTURE) ×8 IMPLANT
SUT VIC AB 1 CT1 27 (SUTURE)
SUT VIC AB 1 CT1 27XBRD ANBCTR (SUTURE) IMPLANT
SUT VIC AB 2-0 CT1 27 (SUTURE) ×1
SUT VIC AB 2-0 CT1 TAPERPNT 27 (SUTURE) ×3 IMPLANT
SUT VIC AB 3-0 SH 27 (SUTURE) ×1
SUT VIC AB 3-0 SH 27XBRD (SUTURE) ×3 IMPLANT
SYR CONTROL 10ML LL (SYRINGE) ×4 IMPLANT
SYRINGE TOOMEY DISP (SYRINGE) ×4 IMPLANT
TOWEL GREEN STERILE (TOWEL DISPOSABLE) ×4 IMPLANT
TOWEL GREEN STERILE FF (TOWEL DISPOSABLE) ×4 IMPLANT
TRAY FOLEY W/BAG SLVR 14FR (SET/KITS/TRAYS/PACK) IMPLANT
TRAY LAPAROSCOPIC MC (CUSTOM PROCEDURE TRAY) ×4 IMPLANT
TROCAR OPTICAL SHORT 5MM (TROCAR) ×4 IMPLANT
TROCAR OPTICAL SLV SHORT 5MM (TROCAR) ×4 IMPLANT
TROCAR XCEL 12X100 BLDLESS (ENDOMECHANICALS) ×4 IMPLANT
WARMER LAPAROSCOPE (MISCELLANEOUS) ×4 IMPLANT
WATER STERILE IRR 1000ML POUR (IV SOLUTION) ×4 IMPLANT

## 2020-07-04 NOTE — Op Note (Signed)
07/04/2020  8:46 AM  PATIENT:  Reginald Fischer  45 y.o. male  PRE-OPERATIVE DIAGNOSIS:  BILATERAL INGUINAL HERNIAS UMBILICAL HERNIA  POST-OPERATIVE DIAGNOSIS:  BILATERAL INGUINAL HERNIAS UMBILICAL HERNIA  PROCEDURE:  Procedure(s): LAPAROSCOPIC BILATERAL INGUINAL HERNIA REPAIR WITH MESH (Bilateral) UMBILICAL HERNIA REPAIR  SURGEON:  Surgeon(s) and Role:    Axel Filler, MD - Primary  ASSISTANTS: Berenda Morale, RNFA   ANESTHESIA:   local and general  EBL:  minimal   BLOOD ADMINISTERED:none  DRAINS: none   LOCAL MEDICATIONS USED:  BUPIVICAINE   SPECIMEN:  No Specimen  DISPOSITION OF SPECIMEN:  N/A  COUNTS:  YES  TOURNIQUET:  * No tourniquets in log *  DICTATION: .Dragon Dictation  Counts: reported as correct x 2  Findings:  The patient had a small right direct and indirect hernia and left indirect hernia  Indications for procedure:  The patient is a 45 year old male with bilateral hernias for several months. Patient complained of symptomatology to his bilateral inguinal areas. The patient was taken back for elective inguinal hernia repair.  Details of the procedure: The patient was taken back to the operating room. The patient was placed in supine position with bilateral SCDs in place.  The patient underwent GETA.  A foley catheter was placed. The patient was prepped and draped in the usual sterile fashion.  After appropriate anitbiotics were confirmed, a time-out was confirmed and all facts were verified.  0.25% Marcaine was used to infiltrate the umbilical area. A 11-blade was used to cut down the skin and blunt dissection was used to get the anterior fashion.  The anterior fascia was incised approximately 1 cm and the muscles were retracted laterally. Blunt dissection was then used to create a space in the preperitoneal area. At this time a 10 mm camera was then introduced into the space and advanced the pubic tubercle and a 12 mm trocar was placed over this  and insufflation was started.  At this time and space was created from medial to laterally the preperitoneal space.  Cooper's ligament was initially cleaned off.  The hernia sac was identified and dissected away from the cremesteric muscle fibers.  The hernia was seen in the indirect space. Dissection of the hernia sac was undertaken the vas deferens was identified and protected in all parts of the case. There was also a moderate size right direct inguinal hernia.  There was a small tear into the hernia sac.  This was ligated with a PDS Endoloop.  A Veress needle right upper quadrant to help evacuate the intraperitoneal air.    Once the hernia sac was taken down to approximately the umbilicus a Bard 3D Max mesh, size: Barney Drain, was  introduced into the preperitoneal space.  The mesh was brought over to cover the direct and indirect hernia spaces.  This was anchored into place and secured to Cooper's ligament with 4.37mm staples from a Coviden hernia stapler. It was anchored to the anterior abdominal wall with 4.8 mm staples. The hernia sac was seen lying posterior to the mesh. There was no staples placed laterally.   The exact same dissection took place on the left side. The spermatic cord was identified.  The hernia sac was identified and dissected away from the cremesteric muscle fibers.  The hernia was seen in the indirect space. Dissection of the hernia sac was undertaken the vas deferens was identified and protected in all parts of the case. The insufflation was evacuated and the peritoneum was seen posterior to the  mesh bilaterally. The trochars were removed.   The incision at the umbilicus was used to repair the umbilical hernia.  Blunt dissection was taken down to the anterior fascia.  The umbilical stalk was then taken off the abdominal wall.  The fascia was then elevated at the umbilical hernia site.  The surrounding fascia was then cleaned out from the subcutaneous tissue.  At this time the  peritoneum was entered bluntly.  There is no bowel within the hernia.    The fascia was reapproximated from the mesh using 0 Ethibonds in an interrupted fashion.  The umbilical stalk was then secured to the anterior abdominal wall using a 3-0 Vicryl x1.  The skin was then reapproximated 4-0 Monocryl subcuticular fashion.  The skin was then dressed with Dermabond.  The anterior fascia was reapproximated using #1 Vicryl on a UR- 6.  Intra-abdominal air was evacuated and the Veress needle removed. The skin was reapproximated using 4-0 Monocryl subcuticular fashion and was dressed with Dermabond. The  patient was awakened from general anesthesia and taken to recovery in stable condition.    PLAN OF CARE: Discharge to home after PACU  PATIENT DISPOSITION:  PACU - hemodynamically stable.   Delay start of Pharmacological VTE agent (>24hrs) due to surgical blood loss or risk of bleeding: not applicable

## 2020-07-04 NOTE — Discharge Instructions (Signed)
[facility] _______[facility] ° °UMBILICAL & INGUINAL HERNIA REPAIR: POST OP INSTRUCTIONS ° °Always review your discharge instruction sheet given to you by the facility where your surgery was performed. °IF YOU HAVE DISABILITY OR FAMILY LEAVE FORMS, YOU MUST BRING THEM TO THE OFFICE FOR PROCESSING.   °DO NOT GIVE THEM TO YOUR DOCTOR. ° °1. A  prescription for pain medication may be given to you upon discharge.  Take your pain medication as prescribed, if needed.  If narcotic pain medicine is not needed, then you may take acetaminophen (Tylenol) or ibuprofen (Advil) as needed. °2. Take your usually prescribed medications unless otherwise directed. °If you need a refill on your pain medication, please contact your pharmacy.  They will contact our office to request authorization. Prescriptions will not be filled after 5 pm or on week-ends. °3. You should follow a light diet the first 24 hours after arrival home, such as soup and crackers, etc.  Be sure to include lots of fluids daily.  Resume your normal diet the day after surgery. °4.Most patients will experience some swelling and bruising around the umbilicus or in the groin and scrotum.  Ice packs and reclining will help.  Swelling and bruising can take several days to resolve.  °6. It is common to experience some constipation if taking pain medication after surgery.  Increasing fluid intake and taking a stool softener (such as Colace) will usually help or prevent this problem from occurring.  A mild laxative (Milk of Magnesia or Miralax) should be taken according to package directions if there are no bowel movements after 48 hours. °7. Unless discharge instructions indicate otherwise, you may remove your bandages 24-48 hours after surgery, and you may shower at that time.  You may have steri-strips (small skin tapes) in place directly over the incision.  These strips should be left on the skin for 7-10 days.  If your surgeon used skin glue on the  incision, you may shower in 24 hours.  The glue will flake off over the next 2-3 weeks.  Any sutures or staples will be removed at the office during your follow-up visit. °8. ACTIVITIES:  You may resume regular (light) daily activities beginning the next day--such as daily self-care, walking, climbing stairs--gradually increasing activities as tolerated.  You may have sexual intercourse when it is comfortable.  Refrain from any heavy lifting or straining until approved by your doctor. ° °a.You may drive when you are no longer taking prescription pain medication, you can comfortably wear a seatbelt, and you can safely maneuver your car and apply brakes. °b.RETURN TO WORK:   °_____________________________________________ ° °9.You should see your doctor in the office for a follow-up appointment approximately 2-3 weeks after your surgery.  Make sure that you call for this appointment within a day or two after you arrive home to insure a convenient appointment time. °10.OTHER INSTRUCTIONS: _________________________ °   _____________________________________ ° °WHEN TO CALL YOUR DOCTOR: °1. Fever over 101.0 °2. Inability to urinate °3. Nausea and/or vomiting °4. Extreme swelling or bruising °5. Continued bleeding from incision. °6. Increased pain, redness, or drainage from the incision ° °The clinic staff is available to answer your questions during regular business hours.  Please don’t hesitate to call and ask to speak to one of the nurses for clinical concerns.  If you have a medical emergency, go to the nearest emergency room or call 911.  A surgeon from [facility] is always on call at the hospital ° ° °  [address], [address], [place], [place]  [address] ? ° [address] [address], [place],    [address] °[phone] ? [phone] ? FAX [phone] °Web site: [email or web address] ° °

## 2020-07-04 NOTE — Transfer of Care (Signed)
Immediate Anesthesia Transfer of Care Note  Patient: Reginald Fischer  Procedure(s) Performed: LAPAROSCOPIC BILATERAL INGUINAL HERNIA REPAIR WITH MESH (Bilateral Abdomen) UMBILICAL HERNIA REPAIR WITH MESH (N/A Abdomen) INSERTION OF MESH (Bilateral Inguinal)  Patient Location: PACU  Anesthesia Type:General  Level of Consciousness: awake, alert  and oriented  Airway & Oxygen Therapy: Patient Spontanous Breathing  Post-op Assessment: Report given to RN, Post -op Vital signs reviewed and stable and Patient moving all extremities X 4  Post vital signs: Reviewed and stable  Last Vitals:  Vitals Value Taken Time  BP 133/94   Temp    Pulse 89 07/04/20 0902  Resp 18 07/04/20 0902  SpO2 98 % 07/04/20 0902  Vitals shown include unvalidated device data.  Last Pain:  Vitals:   07/04/20 0625  TempSrc: Oral  PainSc:       Patients Stated Pain Goal: 2 (07/04/20 1423)  Complications: No complications documented.

## 2020-07-04 NOTE — Progress Notes (Signed)
Dr. Derrell Lolling paged for orders

## 2020-07-04 NOTE — H&P (Signed)
History of Present Illness The patient is a 45 year old male who presents with an inguinal hernia. Patient is a 45 year old male, who comes in secondary to right inguinal hernia pain. Patient states this is been there since May of last year. He states that he's had some discomfort on and off. He has more discomfort when he is lifting, doing strenuous activity. He's been wearing a truss which is been helping him.  He's had no signs or symptoms of incarceration or granulation. He's had no previous abdominal surgery.     Past Surgical History No pertinent past surgical history   Diagnostic Studies History Colonoscopy  never  Allergies No Known Drug Allergies  [04/18/2020]: No Known Allergies  [04/18/2020]: Allergies Reconciled   Medication History  Gabapentin (10% Cream, External) Active. Meloxicam (15MG  Tablet, Oral) Active. predniSONE (50MG  Tablet, Oral) Active. Medications Reconciled  Social History Alcohol use  Occasional alcohol use. Caffeine use  Carbonated beverages, Coffee, Tea. Illicit drug use  Prefer to discuss with provider. Tobacco use  Current some day smoker.  Family History Family history unknown  First Degree Relatives   Other Problems Arthritis  Back Pain  Gastroesophageal Reflux Disease  Hypercholesterolemia     Review of Systems General Not Present- Appetite Loss, Chills, Fatigue, Fever, Night Sweats, Weight Gain and Weight Loss. Skin Not Present- Change in Wart/Mole, Dryness, Hives, Jaundice, New Lesions, Non-Healing Wounds, Rash and Ulcer. HEENT Not Present- Earache, Hearing Loss, Hoarseness, Nose Bleed, Oral Ulcers, Ringing in the Ears, Seasonal Allergies, Sinus Pain, Sore Throat, Visual Disturbances, Wears glasses/contact lenses and Yellow Eyes. Respiratory Present- Snoring. Not Present- Bloody sputum, Chronic Cough, Difficulty Breathing and Wheezing. Breast Not Present- Breast Mass, Breast Pain, Nipple Discharge and Skin  Changes. Cardiovascular Not Present- Chest Pain, Difficulty Breathing Lying Down, Leg Cramps, Palpitations, Rapid Heart Rate, Shortness of Breath and Swelling of Extremities. Male Genitourinary Not Present- Blood in Urine, Change in Urinary Stream, Frequency, Impotence, Nocturia, Painful Urination, Urgency and Urine Leakage. Musculoskeletal Present- Back Pain, Joint Pain, Joint Stiffness and Muscle Pain. Not Present- Muscle Weakness and Swelling of Extremities. Neurological Not Present- Decreased Memory, Fainting, Headaches, Numbness, Seizures, Tingling, Tremor, Trouble walking and Weakness. Hematology Not Present- Blood Thinners, Easy Bruising, Excessive bleeding, Gland problems, HIV and Persistent Infections. All other systems negative  BP 133/85   Pulse 74   Temp 97.9 F (36.6 C) (Oral)   Resp 17   Ht 5\' 9"  (1.753 m)   Wt 74.8 kg   SpO2 98%   BMI 24.35 kg/m     Physical Exam The physical exam findings are as follows: Note: Constitutional: No acute distress, conversant, appears stated age  Eyes: Anicteric sclerae, moist conjunctiva, no lid lag  Neck: No thyromegaly, trachea midline, no cervical lymphadenopathy  Lungs: Clear to auscultation biilaterally, normal respiratory effot  Cardiovascular: regular rate & rhythm, no murmurs, no peripheal edema, pedal pulses 2+  GI: Soft, no masses or hepatosplenomegaly, non-tender to palpation  MSK: Normal gait, no clubbing cyanosis, edema  Skin: No rashes, palpation reveals normal skin turgor  Psychiatric: Appropriate judgment and insight, oriented to person, place, and time  Abdomen Inspection Hernias - Hernias - Umbilical hernia - Note: Small primary umbilical hernia. Inguinal hernia - Bilateral - Reducible - Bilateral.    Assessment & Plan BILATERAL INGUINAL HERNIA WITHOUT OBSTRUCTION OR GANGRENE, RECURRENCE NOT SPECIFIED (K40.20) Impression: 45 year old male with bilateral inguinal hernias, umbilical hernia  1. The  patient will like to proceed to the operating room for laparoscopic bilateral inguinal  hernia repair with mesh, And umbilical hernia repair with possible mesh 2. I discussed with the patient the signs and symptoms of incarceration and strangulation and the need to proceed to the ER should they occur.  3. I discussed with the patient the risks and benefits of the procedure to include but not limited to: Infection, bleeding, damage to surrounding structures, possible need for further surgery, possible nerve pain, and possible recurrence. The patient was understanding and wishes to proceed.  UMBILICAL HERNIA WITHOUT OBSTRUCTION AND WITHOUT GANGRENE (K42.9)

## 2020-07-04 NOTE — Anesthesia Postprocedure Evaluation (Signed)
Anesthesia Post Note  Patient: Reginald Fischer  Procedure(s) Performed: LAPAROSCOPIC BILATERAL INGUINAL HERNIA REPAIR WITH MESH (Bilateral Abdomen) UMBILICAL HERNIA REPAIR WITH MESH (N/A Abdomen) INSERTION OF MESH (Bilateral Inguinal)     Patient location during evaluation: PACU Anesthesia Type: General Level of consciousness: awake and alert and oriented Pain management: pain level controlled Vital Signs Assessment: post-procedure vital signs reviewed and stable Respiratory status: spontaneous breathing, nonlabored ventilation and respiratory function stable Cardiovascular status: blood pressure returned to baseline Postop Assessment: no apparent nausea or vomiting Anesthetic complications: no   No complications documented.               Kaylyn Layer

## 2020-07-04 NOTE — Anesthesia Procedure Notes (Signed)
Procedure Name: Intubation Date/Time: 07/04/2020 7:39 AM Performed by: Rande Brunt, CRNA Pre-anesthesia Checklist: Patient identified, Emergency Drugs available, Suction available and Patient being monitored Patient Re-evaluated:Patient Re-evaluated prior to induction Oxygen Delivery Method: Circle System Utilized Preoxygenation: Pre-oxygenation with 100% oxygen Induction Type: IV induction Ventilation: Mask ventilation without difficulty Laryngoscope Size: Mac and 4 Grade View: Grade I Tube type: Oral Tube size: 7.5 mm Number of attempts: 1 Airway Equipment and Method: Stylet and Oral airway Placement Confirmation: ETT inserted through vocal cords under direct vision,  positive ETCO2 and breath sounds checked- equal and bilateral Secured at: 23 cm Tube secured with: Tape Dental Injury: Teeth and Oropharynx as per pre-operative assessment

## 2020-07-05 ENCOUNTER — Encounter (HOSPITAL_COMMUNITY): Payer: Self-pay | Admitting: General Surgery

## 2020-07-31 ENCOUNTER — Other Ambulatory Visit: Payer: Self-pay | Admitting: Medical-Surgical

## 2020-09-04 ENCOUNTER — Other Ambulatory Visit: Payer: Self-pay | Admitting: Medical-Surgical

## 2021-11-06 IMAGING — DX DG CERVICAL SPINE COMPLETE 4+V
6 series · 8 of 8 positions shown · non-contrast
Comparison: None.

CLINICAL DATA: Neck pain for several years, no known injury,
initial encounter

EXAM:
CERVICAL SPINE - COMPLETE 4+ VIEW

[Series 1: c-spine lat · 0.14mm/px · 3 of 3 slices shown]
[im 1/3]
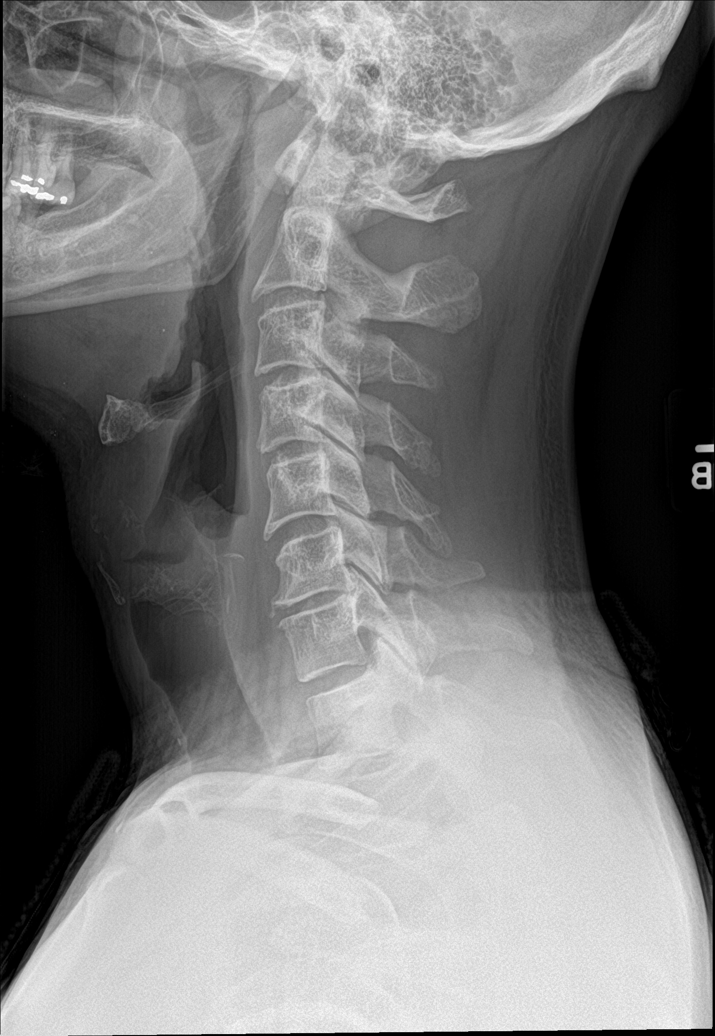
[im 2/3]
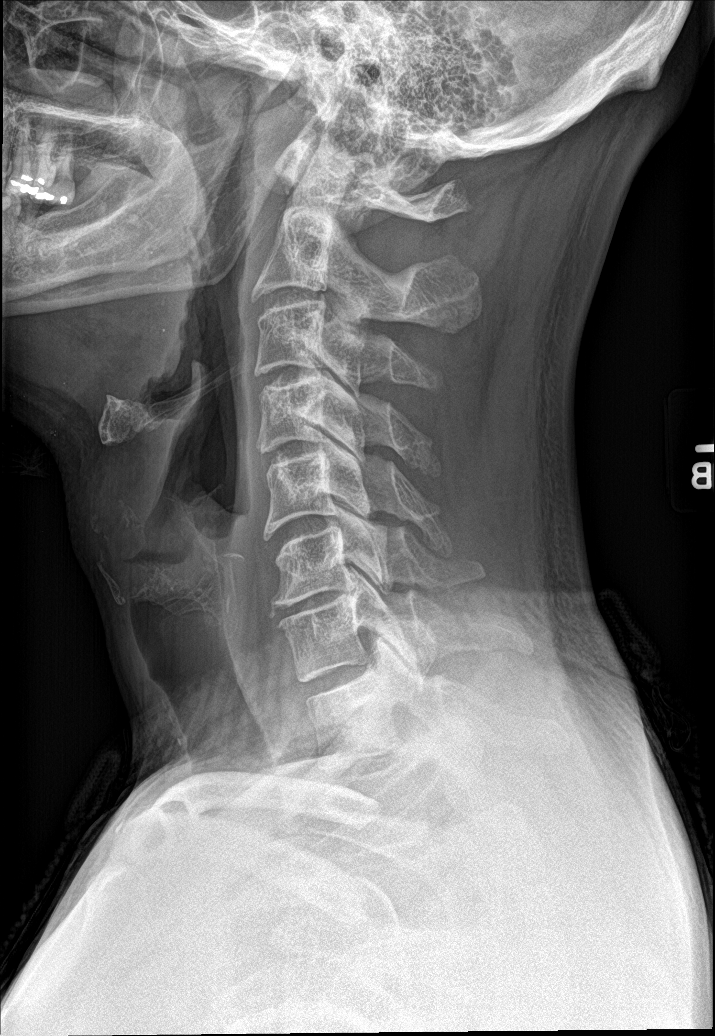
[im 3/3]
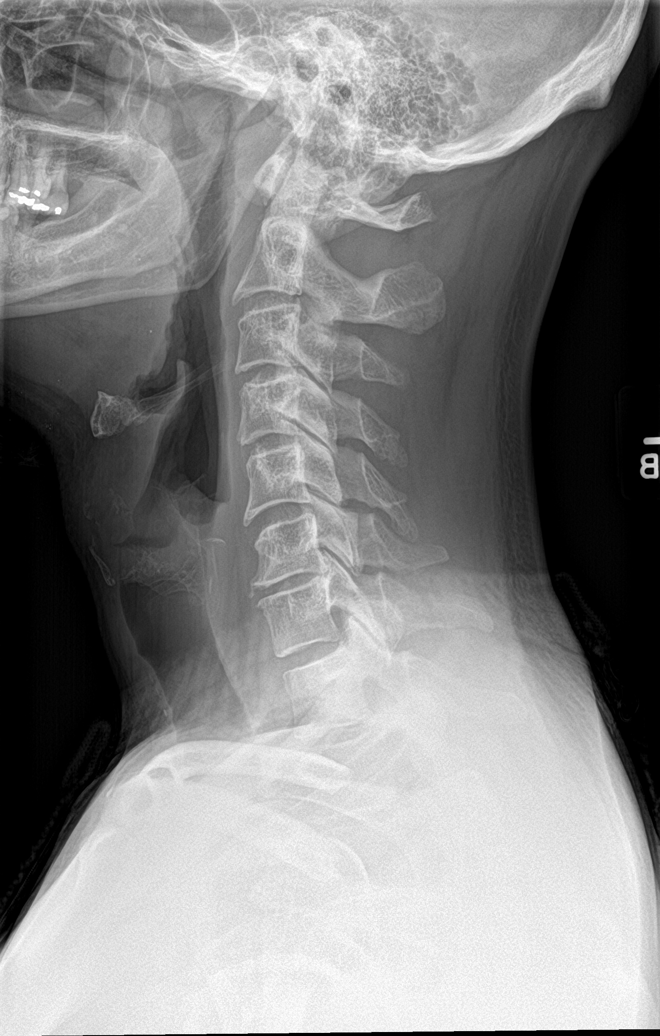

[c-spine obl (1 of 2)]
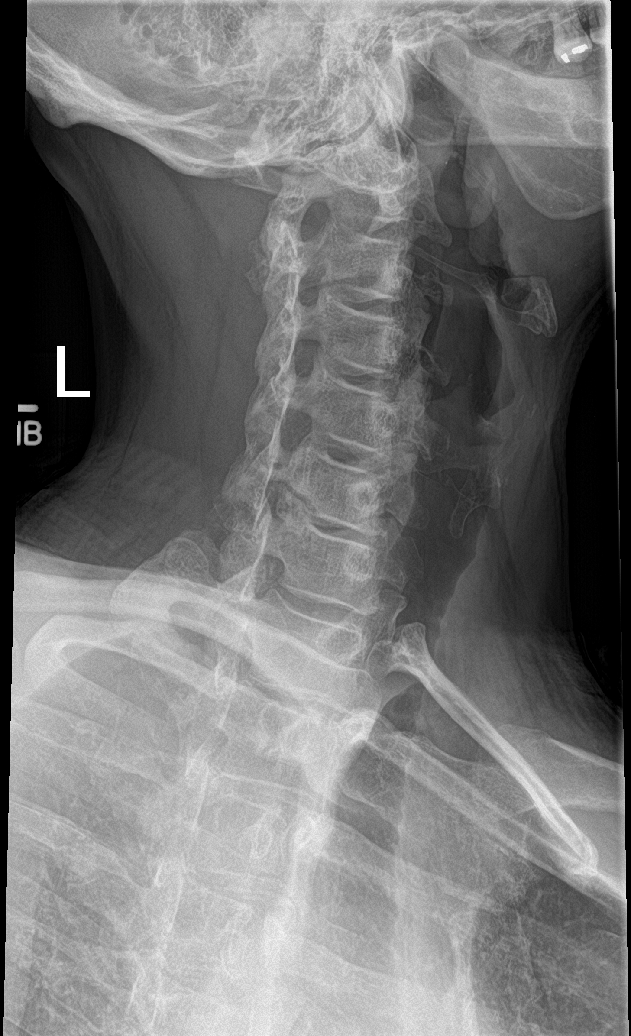

[c-spine obl (2 of 2)]
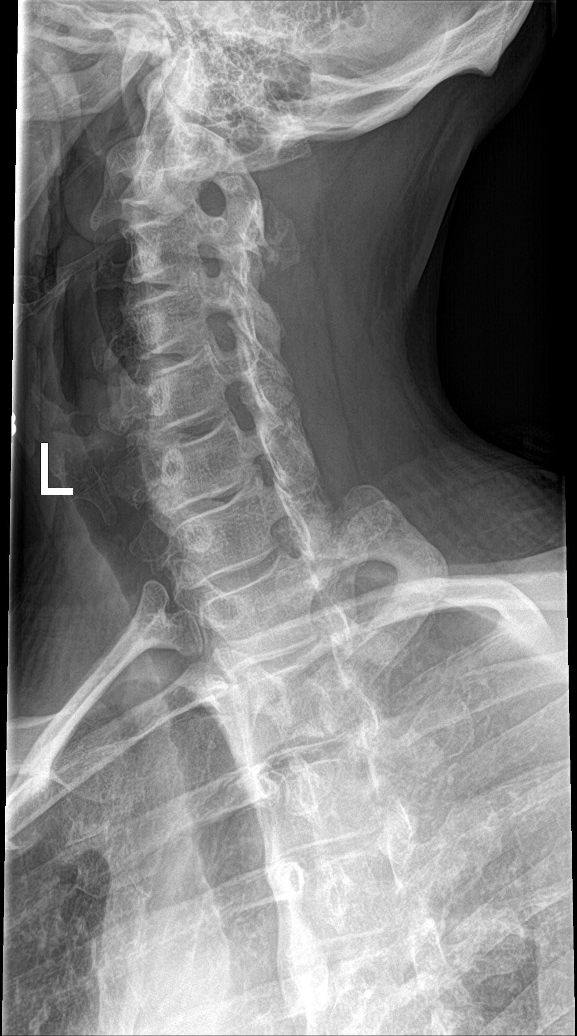

[c-spine ap]
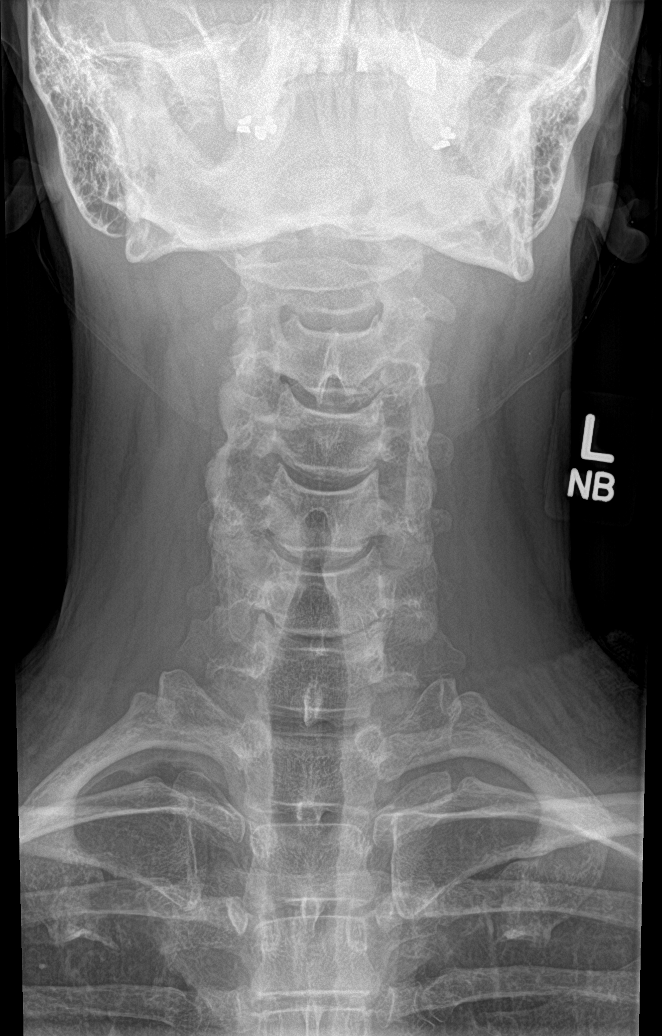

[c-spine open mouth]
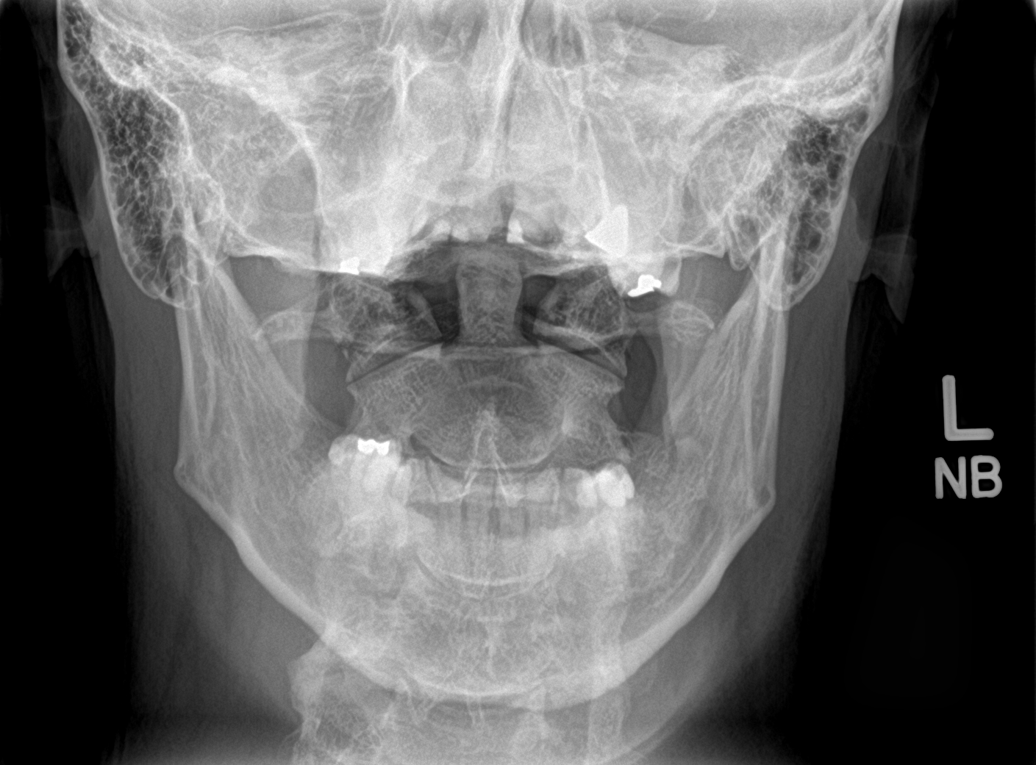

[c-spine swimmers]
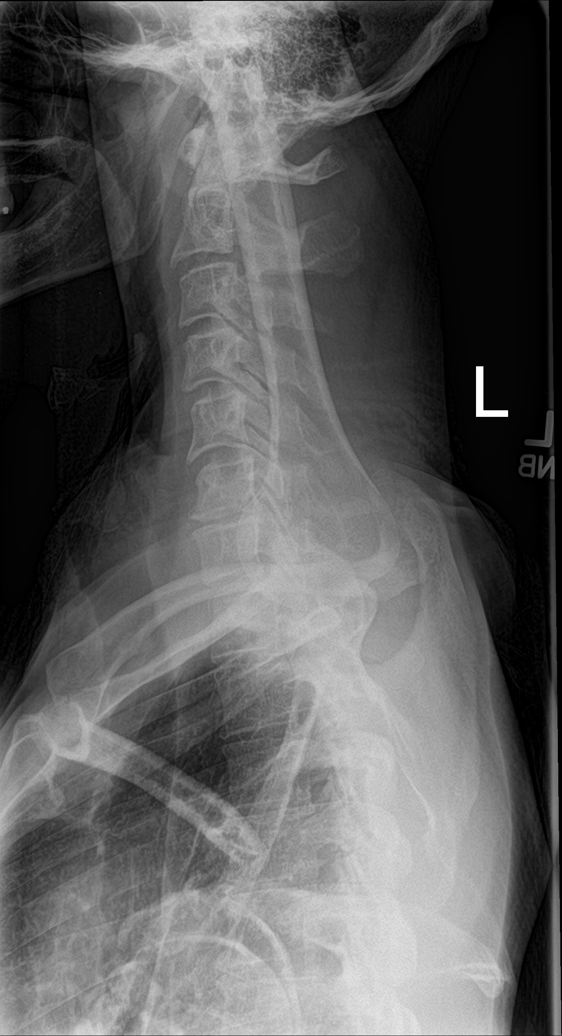

[8 of 8 positions shown; findings below may reference images not displayed]

FINDINGS: Seven cervical segments are well visualized. Vertebral body height
is well maintained. Osteophytic changes are noted. Disc space
narrowing is seen at C6-7. No prevertebral soft tissue changes are
noted. No acute fracture or acute facet abnormality is noted. Mild
neural foraminal narrowing is noted bilaterally at C6-7. Visualized
upper chest is within normal limits. The odontoid is within normal
limits.
IMPRESSION: Degenerative change at C6-7 bilateral neural foraminal narrowing. No
other focal abnormality is seen.

## 2022-04-16 DIAGNOSIS — G4700 Insomnia, unspecified: Secondary | ICD-10-CM | POA: Diagnosis not present

## 2022-04-16 DIAGNOSIS — R0683 Snoring: Secondary | ICD-10-CM | POA: Diagnosis not present

## 2022-04-16 DIAGNOSIS — R5383 Other fatigue: Secondary | ICD-10-CM | POA: Diagnosis not present

## 2022-04-16 DIAGNOSIS — R400 Somnolence: Secondary | ICD-10-CM | POA: Diagnosis not present

## 2022-07-16 ENCOUNTER — Telehealth: Payer: Self-pay

## 2022-07-16 NOTE — Telephone Encounter (Signed)
Patient established with PCP outside of [facility] and declined to schedule. AS, CMA

## 2022-08-26 DIAGNOSIS — G4733 Obstructive sleep apnea (adult) (pediatric): Secondary | ICD-10-CM | POA: Diagnosis not present

## 2022-11-18 DIAGNOSIS — G4733 Obstructive sleep apnea (adult) (pediatric): Secondary | ICD-10-CM | POA: Diagnosis not present

## 2022-11-18 DIAGNOSIS — F5101 Primary insomnia: Secondary | ICD-10-CM | POA: Diagnosis not present

## 2022-12-18 DIAGNOSIS — F5101 Primary insomnia: Secondary | ICD-10-CM | POA: Diagnosis not present

## 2022-12-18 DIAGNOSIS — G4733 Obstructive sleep apnea (adult) (pediatric): Secondary | ICD-10-CM | POA: Diagnosis not present

## 2023-01-18 DIAGNOSIS — G4733 Obstructive sleep apnea (adult) (pediatric): Secondary | ICD-10-CM | POA: Diagnosis not present

## 2023-01-18 DIAGNOSIS — F5101 Primary insomnia: Secondary | ICD-10-CM | POA: Diagnosis not present

## 2023-02-09 DIAGNOSIS — R051 Acute cough: Secondary | ICD-10-CM | POA: Diagnosis not present
